# Patient Record
Sex: Female | Born: 1945 | Race: White | Hispanic: No
Health system: Southern US, Community
[De-identification: ages and names within clinical notes are randomized; demographics above are authoritative.]

---

## 2021-01-04 ENCOUNTER — Other Ambulatory Visit: Payer: Self-pay

## 2021-01-04 ENCOUNTER — Emergency Department
Admission: EM | Admit: 2021-01-04 | Discharge: 2021-01-04 | Disposition: A | Discharge status: Home / Self Care | Payer: No Typology Code available for payment source | Attending: Emergency Medicine | Admitting: Emergency Medicine

## 2021-01-04 ENCOUNTER — Emergency Department: Payer: No Typology Code available for payment source

## 2021-01-04 DIAGNOSIS — S0003XA Contusion of scalp, initial encounter: Secondary | ICD-10-CM | POA: Diagnosis not present

## 2021-01-04 DIAGNOSIS — R110 Nausea: Secondary | ICD-10-CM | POA: Diagnosis not present

## 2021-01-04 DIAGNOSIS — S0990XA Unspecified injury of head, initial encounter: Secondary | ICD-10-CM | POA: Diagnosis present

## 2021-01-04 DIAGNOSIS — Y92410 Unspecified street and highway as the place of occurrence of the external cause: Secondary | ICD-10-CM | POA: Diagnosis not present

## 2021-01-04 MED ORDER — TRAMADOL HCL 50 MG PO TABS: 50.0000 mg | ORAL_TABLET | Freq: Three times a day (TID) | ORAL | 0 refills | Status: AC | PRN

## 2021-01-04 MED ORDER — ACETAMINOPHEN 325 MG PO TABS
650.0000 mg | ORAL_TABLET | Freq: Once | ORAL | Status: AC
  Administered 2021-01-04: 650 mg via ORAL
  Filled 2021-01-04: qty 2

## 2021-01-04 MED ORDER — TRAMADOL HCL 50 MG PO TABS: 50.0000 mg | ORAL_TABLET | Freq: Three times a day (TID) | ORAL | 0 refills | Status: DC | PRN

## 2021-01-04 NOTE — ED Notes (Signed)
Pt back from CT

## 2021-01-04 NOTE — ED Notes (Signed)
See triage note. Pt in NAD. Pt states is painful to turn head to side.

## 2021-01-04 NOTE — Discharge Instructions (Signed)
Follow-up with your primary care provider if any continued problems or concerns.  You may use ice or heat to your muscles as needed for discomfort.  Apply ice to the back of your head to help with swelling of your scalp.  You may take Tylenol if needed for headache or pain.  Also a stronger medication tramadol was sent to your pharmacy.  Do not drive or operate machinery while taking this medication as it could cause drowsiness and increase your risk for falling.  Follow-up in the nearest emergency department if you develop any severe worsening of your symptoms.
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
